# Patient Record
Sex: Male | Born: 1950 | Hispanic: No | Marital: Married
Health system: Southern US, Community
[De-identification: ages and names within clinical notes are randomized; demographics above are authoritative.]

---

## 2018-04-14 ENCOUNTER — Emergency Department (HOSPITAL_BASED_OUTPATIENT_CLINIC_OR_DEPARTMENT_OTHER)
Admission: EM | Admit: 2018-04-14 | Discharge: 2018-04-15 | Disposition: A | Discharge status: Home / Self Care | Payer: Self-pay | Attending: Emergency Medicine | Admitting: Emergency Medicine

## 2018-04-14 ENCOUNTER — Other Ambulatory Visit: Payer: Self-pay

## 2018-04-14 DIAGNOSIS — N410 Acute prostatitis: Secondary | ICD-10-CM | POA: Insufficient documentation

## 2018-04-14 DIAGNOSIS — E119 Type 2 diabetes mellitus without complications: Secondary | ICD-10-CM | POA: Insufficient documentation

## 2018-04-14 LAB — CBC WITH DIFFERENTIAL/PLATELET
Abs Immature Granulocytes: 0.08 10*3/uL — ABNORMAL HIGH (ref 0.00–0.07)
Basophils Absolute: 0.1 10*3/uL (ref 0.0–0.1)
Basophils Relative: 1 %
Eosinophils Absolute: 0.8 10*3/uL — ABNORMAL HIGH (ref 0.0–0.5)
Eosinophils Relative: 6 %
HCT: 42.6 % (ref 39.0–52.0)
Hemoglobin: 14.1 g/dL (ref 13.0–17.0)
Immature Granulocytes: 1 %
Lymphocytes Relative: 14 %
Lymphs Abs: 1.8 10*3/uL (ref 0.7–4.0)
MCH: 27.9 pg (ref 26.0–34.0)
MCHC: 33.1 g/dL (ref 30.0–36.0)
MCV: 84.2 fL (ref 80.0–100.0)
Monocytes Absolute: 1.6 10*3/uL — ABNORMAL HIGH (ref 0.1–1.0)
Monocytes Relative: 13 %
Neutro Abs: 8.3 10*3/uL — ABNORMAL HIGH (ref 1.7–7.7)
Neutrophils Relative %: 65 %
Platelets: 315 10*3/uL (ref 150–400)
RBC: 5.06 MIL/uL (ref 4.22–5.81)
RDW: 12.3 % (ref 11.5–15.5)
WBC: 12.6 10*3/uL — ABNORMAL HIGH (ref 4.0–10.5)
nRBC: 0 % (ref 0.0–0.2)

## 2018-04-14 LAB — URINALYSIS, MICROSCOPIC (REFLEX): RBC / HPF: NONE SEEN RBC/hpf (ref 0–5)

## 2018-04-14 LAB — URINALYSIS, ROUTINE W REFLEX MICROSCOPIC
Bilirubin Urine: NEGATIVE
Glucose, UA: >=500 mg/dL — AB
Hgb urine dipstick: NEGATIVE
Ketones, ur: NEGATIVE mg/dL
Leukocytes, UA: NEGATIVE
Nitrite: NEGATIVE
Protein, ur: NEGATIVE mg/dL
Specific Gravity, Urine: 1.015 (ref 1.005–1.030)
pH: 5.5 (ref 5.0–8.0)

## 2018-04-14 MED ORDER — SODIUM CHLORIDE 0.9 % IV BOLUS
1000.0000 mL | Freq: Once | INTRAVENOUS | Status: AC
  Administered 2018-04-14: 1000 mL via INTRAVENOUS

## 2018-04-14 NOTE — ED Triage Notes (Signed)
Pt with fever, bilateral flank pain and pain with urination x 1 week. Pt also has dental pain. Pt also reports feeling constipated. Pt denies any URI symptoms.

## 2018-04-14 NOTE — ED Provider Notes (Signed)
[facility] EMERGENCY DEPARTMENT Provider Note   CSN: [id number] Arrival date & time: 04/14/18  2313     History   Chief Complaint Chief Complaint  Patient presents with  . Fever    HPI Trevor Smith is a 68 y.o. male.  Patient reports that he has been experiencing fever for at least 3 days.  Maximum temperature has been 103 at home.  He has been taking ibuprofen and Tylenol with improvement of the fever.  He has also been experiencing low back pain (bilateral flank) and feeling constipated.  He has had slight urinary frequency and reports significant dysuria over this period of time.  He has not had any hematuria.  Patient also has poor dentition.  There has been some swelling around 1 of his teeth that is loose.  He has been taking some leftover amoxicillin for this.  He has not had any cold symptoms.     No past medical history on file. Diabetes There are no active problems to display for this patient.         Home Medications    Prior to Admission medications   Medication Sig Start Date End Date Taking? Authorizing Provider  cefUROXime (CEFTIN) 250 MG tablet Take 1 tablet (250 mg total) by mouth 2 (two) times daily with a meal. 04/15/18   , Canary Brimhristopher J, MD    Family History No family history on file.  Social History Social History   Tobacco Use  . Smoking status: Not on file  Substance Use Topics  . Alcohol use: Not on file  . Drug use: Not on file  non-smoker   Allergies   Patient has no known allergies.   Review of Systems Review of Systems  Constitutional: Positive for fever.  HENT: Positive for dental problem.   Gastrointestinal: Positive for constipation.  Genitourinary: Positive for dysuria, flank pain and frequency.  All other systems reviewed and are negative.    Physical Exam Updated Vital Signs BP 113/67   Pulse 89   Temp 97.9 F (36.6 C) (Oral)   Resp 18   Ht 5\' 7"  (1.702 m)   Wt 74.8 kg   SpO2 98%   BMI  25.84 kg/m   Physical Exam Vitals signs and nursing note reviewed.  Constitutional:      General: He is not in acute distress.    Appearance: Normal appearance. He is well-developed.  HENT:     Head: Normocephalic and atraumatic.     Right Ear: Hearing normal.     Left Ear: Hearing normal.     Nose: Nose normal.  Eyes:     Conjunctiva/sclera: Conjunctivae normal.     Pupils: Pupils are equal, round, and reactive to light.  Neck:     Musculoskeletal: Normal range of motion and neck supple.  Cardiovascular:     Rate and Rhythm: Regular rhythm.     Heart sounds: S1 normal and S2 normal. No murmur. No friction rub. No gallop.   Pulmonary:     Effort: Pulmonary effort is normal. No respiratory distress.     Breath sounds: Normal breath sounds.  Chest:     Chest wall: No tenderness.  Abdominal:     General: Bowel sounds are normal.     Palpations: Abdomen is soft.     Tenderness: There is generalized abdominal tenderness. There is no guarding or rebound. Negative signs include Murphy's sign and McBurney's sign.     Hernia: No hernia is present.  Genitourinary:  Prostate: Enlarged and tender (And boggy).  Musculoskeletal: Normal range of motion.  Skin:    General: Skin is warm and dry.     Findings: No rash.  Neurological:     Mental Status: He is alert and oriented to person, place, and time.     GCS: GCS eye subscore is 4. GCS verbal subscore is 5. GCS motor subscore is 6.     Cranial Nerves: No cranial nerve deficit.     Sensory: No sensory deficit.     Coordination: Coordination normal.  Psychiatric:        Speech: Speech normal.        Behavior: Behavior normal.        Thought Content: Thought content normal.      ED Treatments / Results  Labs (all labs ordered are listed, but only abnormal results are displayed) Labs Reviewed  URINALYSIS, ROUTINE W REFLEX MICROSCOPIC - Abnormal; Notable for the following components:      Result Value   APPearance HAZY (*)     Glucose, UA >=500 (*)    All other components within normal limits  CBC WITH DIFFERENTIAL/PLATELET - Abnormal; Notable for the following components:   WBC 12.6 (*)    Neutro Abs 8.3 (*)    Monocytes Absolute 1.6 (*)    Eosinophils Absolute 0.8 (*)    Abs Immature Granulocytes 0.08 (*)    All other components within normal limits  COMPREHENSIVE METABOLIC PANEL - Abnormal; Notable for the following components:   Sodium 130 (*)    CO2 21 (*)    Glucose, Bld 266 (*)    All other components within normal limits  LACTIC ACID, PLASMA - Abnormal; Notable for the following components:   Lactic Acid, Venous 2.5 (*)    All other components within normal limits  URINALYSIS, MICROSCOPIC (REFLEX) - Abnormal; Notable for the following components:   Bacteria, UA MANY (*)    All other components within normal limits  URINE CULTURE    EKG None  Radiology Ct Abdomen Pelvis W Contrast  Result Date: 04/15/2018 CLINICAL DATA:  Fever, bilateral flank pain and pain with urination for 1 week. EXAM: CT ABDOMEN AND PELVIS WITH CONTRAST TECHNIQUE: Multidetector CT imaging of the abdomen and pelvis was performed using the standard protocol following bolus administration of intravenous contrast. CONTRAST:  100mL ISOVUE-300 IOPAMIDOL (ISOVUE-300) INJECTION 61% COMPARISON:  None. FINDINGS: Lower chest: Dependent atelectasis in the lung bases. Hepatobiliary: Diffuse fatty infiltration of the liver. No focal liver abnormality is seen. No gallstones, gallbladder wall thickening, or biliary dilatation. Pancreas: Unremarkable. No pancreatic ductal dilatation or surrounding inflammatory changes. Spleen: Normal in size without focal abnormality. Adrenals/Urinary Tract: Adrenal glands are unremarkable. Kidneys are normal, without renal calculi, focal lesion, or hydronephrosis. Bladder is unremarkable. Stomach/Bowel: Stomach is within normal limits. Appendix appears normal. No evidence of bowel wall thickening, distention, or  inflammatory changes. Vascular/Lymphatic: Aortic atherosclerosis. Calcific and noncalcific plaque formation in the right common iliac artery demonstrates moderate focal stenosis. No enlarged abdominal or pelvic lymph nodes. Prominent retroperitoneal lymph nodes demonstrate fatty centers, likely reactive. Reproductive: Prostate is unremarkable. Other: No abdominal wall hernia or abnormality. No abdominopelvic ascites. Musculoskeletal: No acute or significant osseous findings. IMPRESSION: No acute process demonstrated in the abdomen or pelvis. No evidence of bowel obstruction or inflammation. Diffuse fatty infiltration of the liver. Electronically Signed   By: Burman NievesWilliam  Stevens M.D.   On: 04/15/2018 01:02    Procedures Procedures (including critical care time)  Medications Ordered  in ED Medications  0.9 %  sodium chloride infusion ( Intravenous New Bag/Given 04/15/18 0125)  sodium chloride 0.9 % bolus 1,000 mL (0 mLs Intravenous Stopped 04/15/18 0109)  iopamidol (ISOVUE-300) 61 % injection 100 mL (100 mLs Intravenous Contrast Given 04/15/18 0042)  cefTRIAXone (ROCEPHIN) 1 g in sodium chloride 0.9 % 100 mL IVPB (1 g Intravenous New Bag/Given 04/15/18 0126)     Initial Impression / Assessment and Plan / ED Course  I have reviewed the triage vital signs and the nursing notes.  Pertinent labs & imaging results that were available during my care of the patient were reviewed by me and considered in my medical decision making (see chart for details).     Patient presents to the emergency department for evaluation of fever.  He has been running a fever for the last several days.  He has also noticed dysuria and urinary frequency.  He has a history of diabetes.  Blood sugars are reasonably well controlled.  At arrival he was slightly tachycardic.  He has a slight leukocytosis.  He does not have a cough and lungs are clear on auscultation, no concern for pneumonia.  He reports bilateral lower abdominal discomfort  as well as distention and feeling constipated.  CT abdomen pelvis is normal.  Rectal examination was performed.  Patient has an enlarged, soft and boggy and very tender prostate.  Urinalysis with many bacteria but no esterase or white cells.  Suspect that he has an acute prostatitis.  Discussed the findings with the patient.  Patient's son translates.  Both of them understand that my concern is that he has early sepsis and would require hospitalization.  After extensive conversation, he has decided that he would like to be discharged and declined admission.  He understands the risk of worsening condition, development of severe sepsis and even death.  Patient administered Rocephin here in the ER and will be discharged with Ceftin, follow-up with urology.  He understands he needs return immediately if he runs high fever, has any worsening symptoms.  Final Clinical Impressions(s) / ED Diagnoses   Final diagnoses:  Acute prostatitis    ED Discharge Orders         Ordered    cefUROXime (CEFTIN) 250 MG tablet  2 times daily with meals     04/15/18 0218           Gilda Crease, MD 04/15/18 0222

## 2018-04-15 ENCOUNTER — Emergency Department (HOSPITAL_BASED_OUTPATIENT_CLINIC_OR_DEPARTMENT_OTHER): Payer: Self-pay

## 2018-04-15 ENCOUNTER — Telehealth (HOSPITAL_BASED_OUTPATIENT_CLINIC_OR_DEPARTMENT_OTHER): Payer: Self-pay | Admitting: Emergency Medicine

## 2018-04-15 LAB — COMPREHENSIVE METABOLIC PANEL
ALT: 32 U/L (ref 0–44)
AST: 32 U/L (ref 15–41)
Albumin: 3.7 g/dL (ref 3.5–5.0)
Alkaline Phosphatase: 105 U/L (ref 38–126)
Anion gap: 9 (ref 5–15)
BILIRUBIN TOTAL: 0.6 mg/dL (ref 0.3–1.2)
BUN: 11 mg/dL (ref 8–23)
CO2: 21 mmol/L — ABNORMAL LOW (ref 22–32)
Calcium: 9 mg/dL (ref 8.9–10.3)
Chloride: 100 mmol/L (ref 98–111)
Creatinine, Ser: 0.87 mg/dL (ref 0.61–1.24)
GFR calc Af Amer: >60 mL/min (ref >60)
GFR calc non Af Amer: >60 mL/min (ref >60)
Glucose, Bld: 266 mg/dL — ABNORMAL HIGH (ref 70–99)
Potassium: 3.9 mmol/L (ref 3.5–5.1)
Sodium: 130 mmol/L — ABNORMAL LOW (ref 135–145)
TOTAL PROTEIN: 7.7 g/dL (ref 6.5–8.1)

## 2018-04-15 LAB — LACTIC ACID, PLASMA: Lactic Acid, Venous: 2.5 mmol/L (ref 0.5–1.9)

## 2018-04-15 MED ORDER — CEFUROXIME AXETIL 250 MG PO TABS: 250.0000 mg | ORAL_TABLET | Freq: Two times a day (BID) | ORAL | 0 refills | Status: DC

## 2018-04-15 MED ORDER — IOPAMIDOL (ISOVUE-300) INJECTION 61%
100.0000 mL | Freq: Once | INTRAVENOUS | Status: AC
  Administered 2018-04-15: 100 mL via INTRAVENOUS

## 2018-04-15 MED ORDER — SODIUM CHLORIDE 0.9 % IV SOLN
1.0000 g | Freq: Once | INTRAVENOUS | Status: AC
  Administered 2018-04-15: 1 g via INTRAVENOUS
  Filled 2018-04-15: qty 10

## 2018-04-15 MED ORDER — SODIUM CHLORIDE 0.9 % IV SOLN
INTRAVENOUS | Status: DC | PRN
  Administered 2018-04-15: 01:00:00 via INTRAVENOUS

## 2018-04-15 MED ORDER — CEFUROXIME AXETIL 250 MG PO TABS: 250.0000 mg | ORAL_TABLET | Freq: Two times a day (BID) | ORAL | 0 refills | Status: AC

## 2018-04-15 NOTE — Telephone Encounter (Signed)
Patient called in to change the pharmacy that his medication was sent to last night. Will send to newly requested pharmacy.

## 2018-04-15 NOTE — ED Notes (Signed)
Pt and family understood dc material. NAD noted. Script sent in electronically. All questions answer to satisfaction

## 2018-04-15 NOTE — ED Notes (Signed)
ED Provider at bedside. 

## 2018-04-16 LAB — URINE CULTURE: Culture: NO GROWTH

## 2019-08-01 IMAGING — CT CT ABD-PELV W/ CM
2 of 5 series · 16 of 46 positions shown, 18 images · IV contrast (APPLIED)
Comparison: None.

CLINICAL DATA: Fever, bilateral flank pain and pain with urination
for 1 week.

EXAM:
CT ABDOMEN AND PELVIS WITH CONTRAST
TECHNIQUE: Multidetector CT imaging of the abdomen and pelvis was performed
using the standard protocol following bolus administration of
intravenous contrast.
CONTRAST:  100mL [id number] IOPAMIDOL ([id number]) INJECTION 61%

[Series 2: axial st · axial · 0.72mm/px · z∈[-355,+60]mm · 13 of 95 slices shown, 15 images]
[im 6/95  soft-tissue]
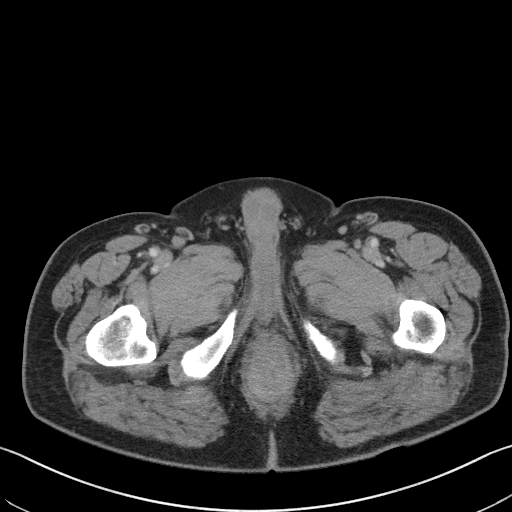
[im 6/95  bone]
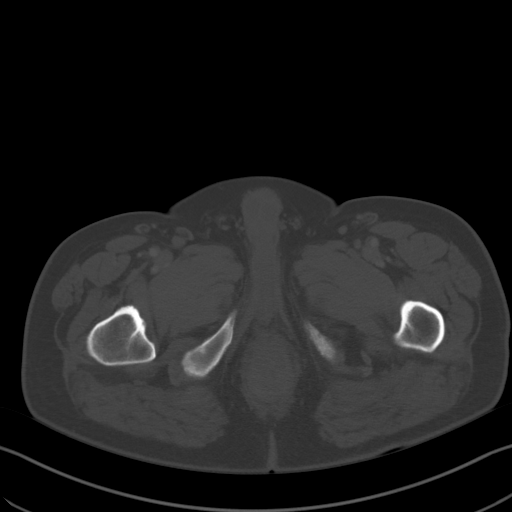
[im 11/95  soft-tissue]
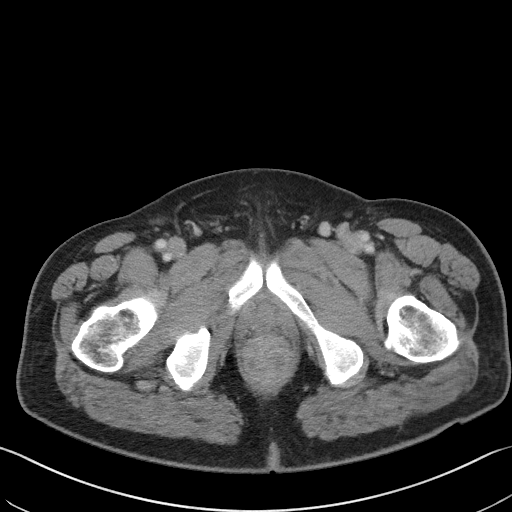
[im 21/95  soft-tissue]
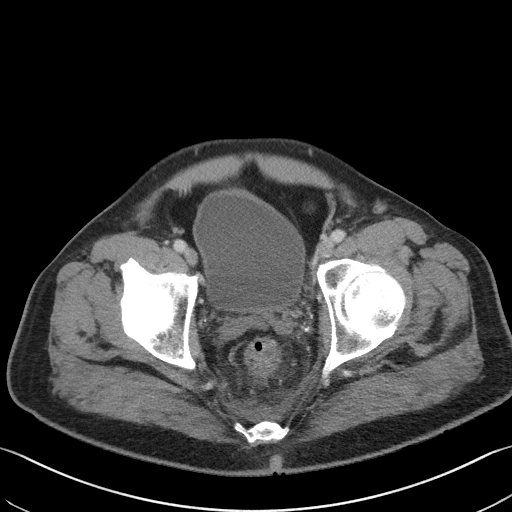
[im 27/95  soft-tissue]
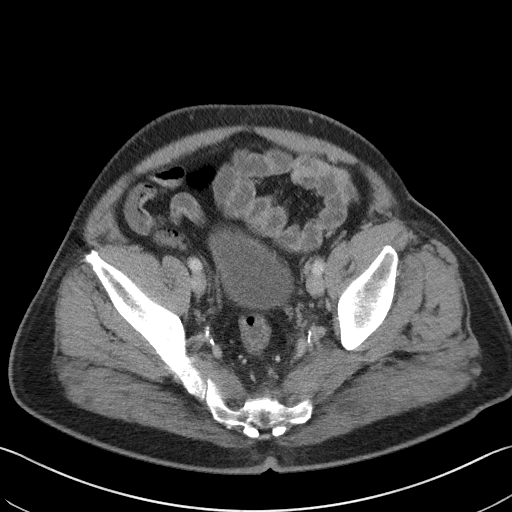
[im 32/95  soft-tissue]
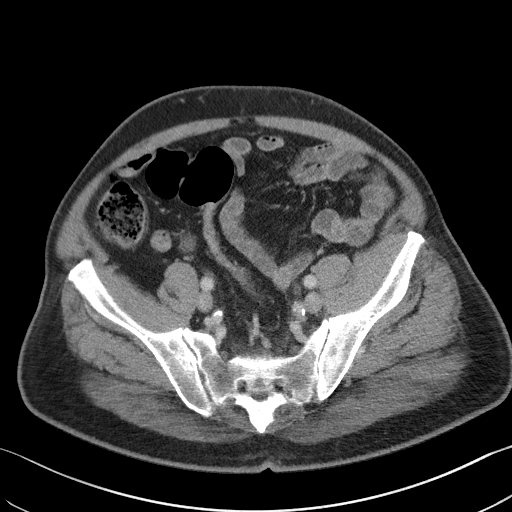
[im 42/95  soft-tissue]
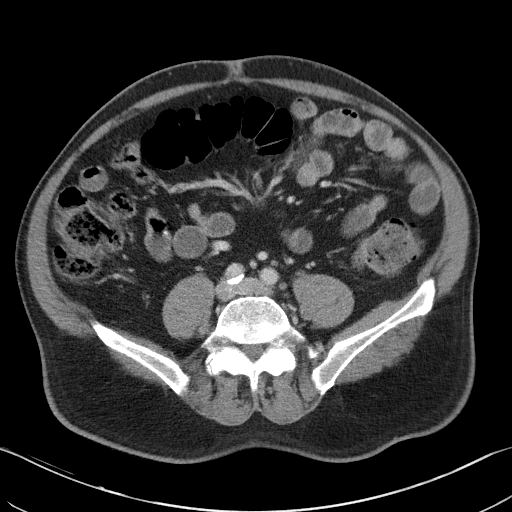
[im 48/95  soft-tissue]
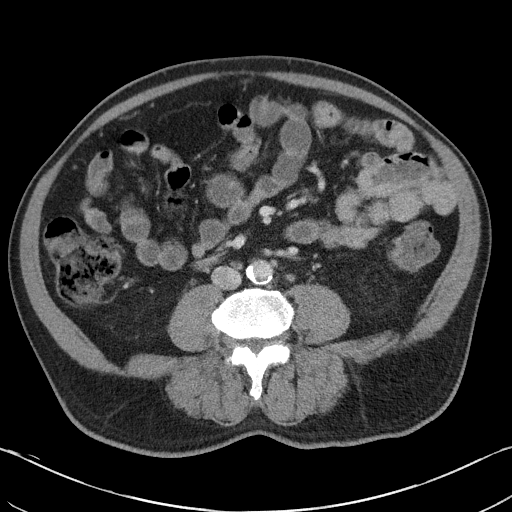
[im 53/95  soft-tissue]
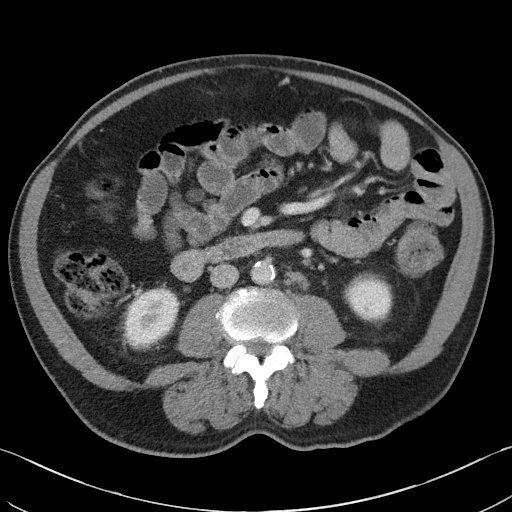
[im 63/95  soft-tissue]
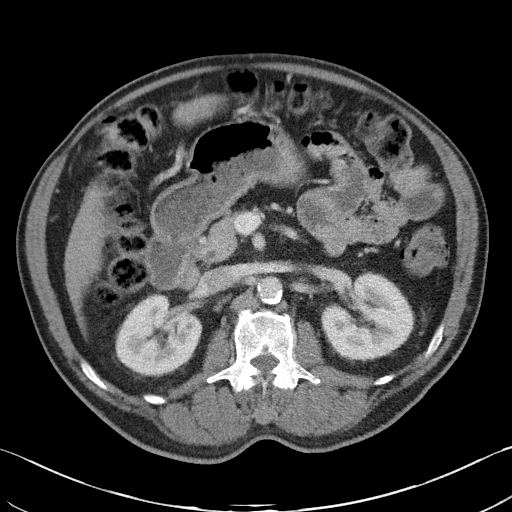
[im 63/95  bone]
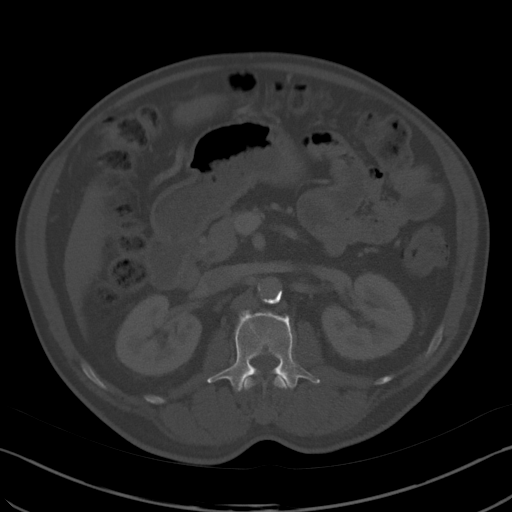
[im 68/95  soft-tissue]
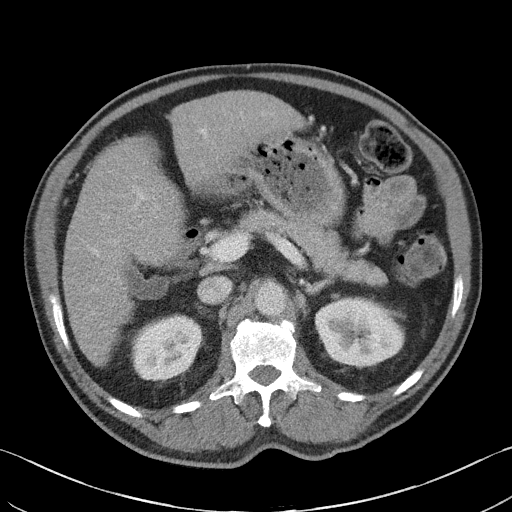
[im 74/95  soft-tissue]
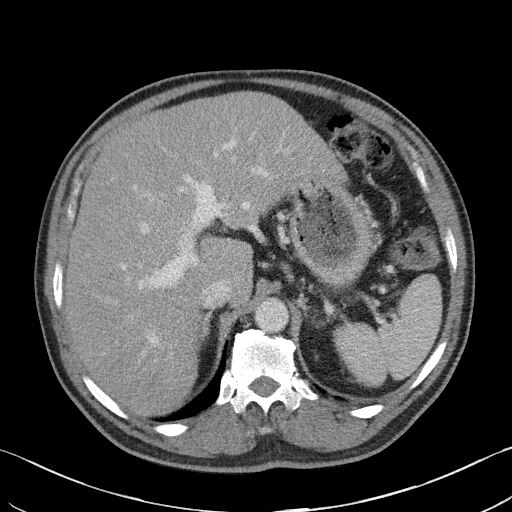
[im 84/95  soft-tissue]
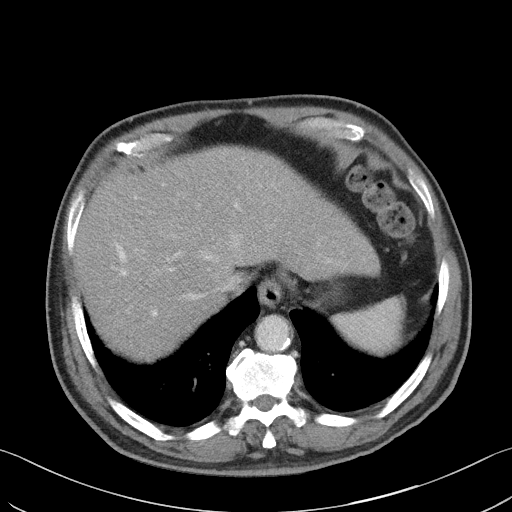
[im 89/95  soft-tissue]
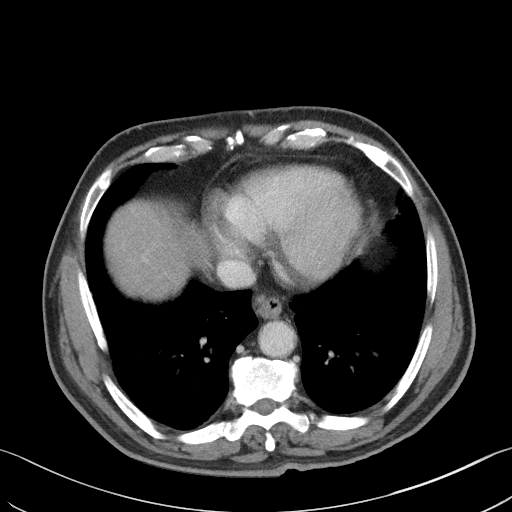

[Series 5: coronal st · coronal · 0.72mm/px · 3 of 109 slices shown]
[im 37/109  soft-tissue]
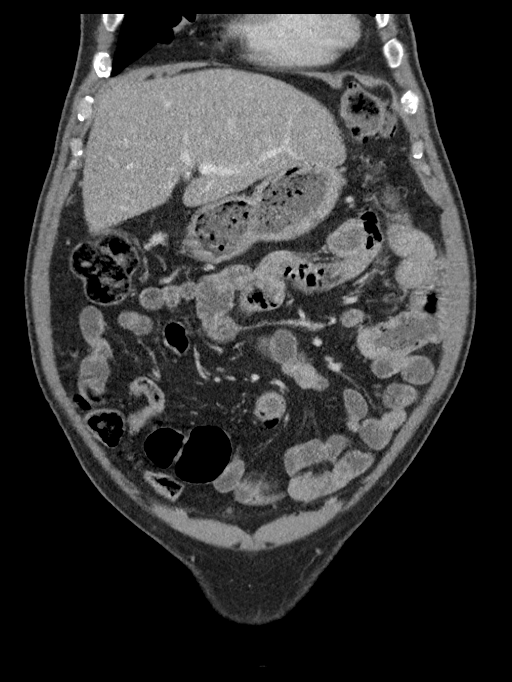
[im 49/109  soft-tissue]
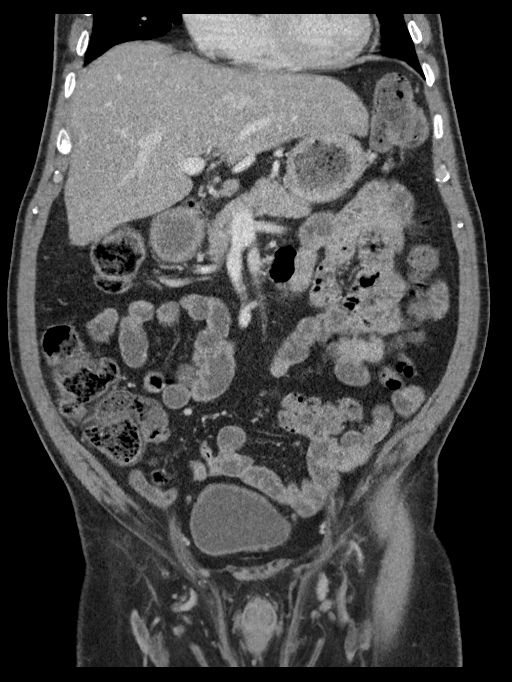
[im 61/109  soft-tissue]
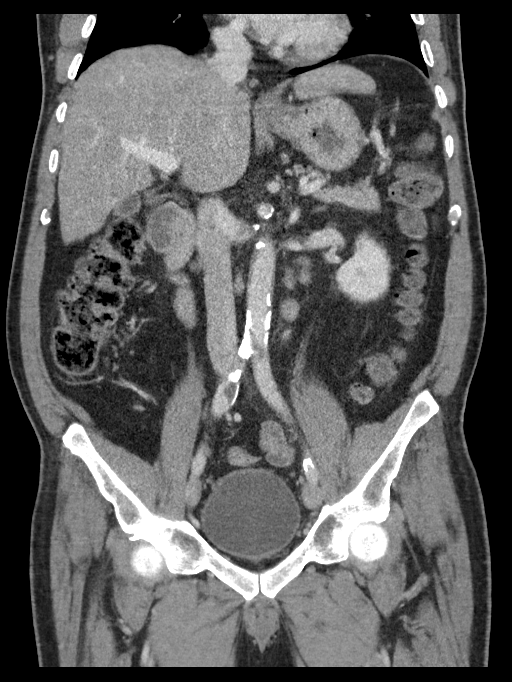

[16 of 46 positions shown; findings below may reference images not displayed]

FINDINGS: Lower chest: Dependent atelectasis in the lung bases.

Hepatobiliary: Diffuse fatty infiltration of the liver. No focal
liver abnormality is seen. No gallstones, gallbladder wall
thickening, or biliary dilatation.

Pancreas: Unremarkable. No pancreatic ductal dilatation or
surrounding inflammatory changes.

Spleen: Normal in size without focal abnormality.

Adrenals/Urinary Tract: Adrenal glands are unremarkable. Kidneys are
normal, without renal calculi, focal lesion, or hydronephrosis.
Bladder is unremarkable.

Stomach/Bowel: Stomach is within normal limits. Appendix appears
normal. No evidence of bowel wall thickening, distention, or
inflammatory changes.

Vascular/Lymphatic: Aortic atherosclerosis. Calcific and noncalcific
plaque formation in the right common iliac artery demonstrates
moderate focal stenosis. No enlarged abdominal or pelvic lymph
nodes. Prominent retroperitoneal lymph nodes demonstrate fatty
centers, likely reactive.

Reproductive: Prostate is unremarkable.

Other: No abdominal wall hernia or abnormality. No abdominopelvic
ascites.

Musculoskeletal: No acute or significant osseous findings.
IMPRESSION: No acute process demonstrated in the abdomen or pelvis. No evidence
of bowel obstruction or inflammation. Diffuse fatty infiltration of
the liver.
